# Patient Record
Sex: Male | Born: 1995 | Race: White | Hispanic: No
Health system: Southern US, Community
[De-identification: ages and names within clinical notes are randomized; demographics above are authoritative.]

## PROBLEM LIST (undated history)

## (undated) DIAGNOSIS — N4400 Torsion of testis, unspecified: Secondary | ICD-10-CM

## (undated) HISTORY — PX: HERNIA REPAIR: SHX51

## (undated) HISTORY — DX: Torsion of testis, unspecified: N44.00

---

## 2019-07-23 DIAGNOSIS — F32A Depression, unspecified: Secondary | ICD-10-CM

## 2019-07-23 DIAGNOSIS — F419 Anxiety disorder, unspecified: Secondary | ICD-10-CM

## 2019-07-23 DIAGNOSIS — I498 Other specified cardiac arrhythmias: Secondary | ICD-10-CM

## 2019-07-23 HISTORY — DX: Depression, unspecified: F32.A

## 2019-07-23 HISTORY — DX: Other specified cardiac arrhythmias: I49.8

## 2019-07-23 HISTORY — DX: Anxiety disorder, unspecified: F41.9

## 2020-07-07 ENCOUNTER — Emergency Department (HOSPITAL_COMMUNITY): Payer: No Typology Code available for payment source

## 2020-07-07 ENCOUNTER — Emergency Department (HOSPITAL_COMMUNITY)
Admission: EM | Admit: 2020-07-07 | Discharge: 2020-07-07 | Disposition: A | Discharge status: Home / Self Care | Payer: No Typology Code available for payment source | Attending: Emergency Medicine | Admitting: Emergency Medicine

## 2020-07-07 ENCOUNTER — Encounter (HOSPITAL_COMMUNITY): Payer: Self-pay

## 2020-07-07 ENCOUNTER — Other Ambulatory Visit: Payer: Self-pay

## 2020-07-07 DIAGNOSIS — X501XXA Overexertion from prolonged static or awkward postures, initial encounter: Secondary | ICD-10-CM | POA: Insufficient documentation

## 2020-07-07 DIAGNOSIS — M25551 Pain in right hip: Secondary | ICD-10-CM

## 2020-07-07 DIAGNOSIS — S7001XA Contusion of right hip, initial encounter: Secondary | ICD-10-CM | POA: Diagnosis not present

## 2020-07-07 DIAGNOSIS — S79911A Unspecified injury of right hip, initial encounter: Secondary | ICD-10-CM | POA: Diagnosis present

## 2020-07-07 DIAGNOSIS — Y990 Civilian activity done for income or pay: Secondary | ICD-10-CM | POA: Insufficient documentation

## 2020-07-07 IMAGING — CR DG HIP (WITH OR WITHOUT PELVIS) 2-3V*R*
3 series · 3 of 3 positions shown · non-contrast
Comparison: None.

CLINICAL DATA: 24-year-old male status post twisting injury with
anterior and lateral hip pain.

EXAM:
DG HIP (WITH OR WITHOUT PELVIS) 2-3V RIGHT

[x pelvis]
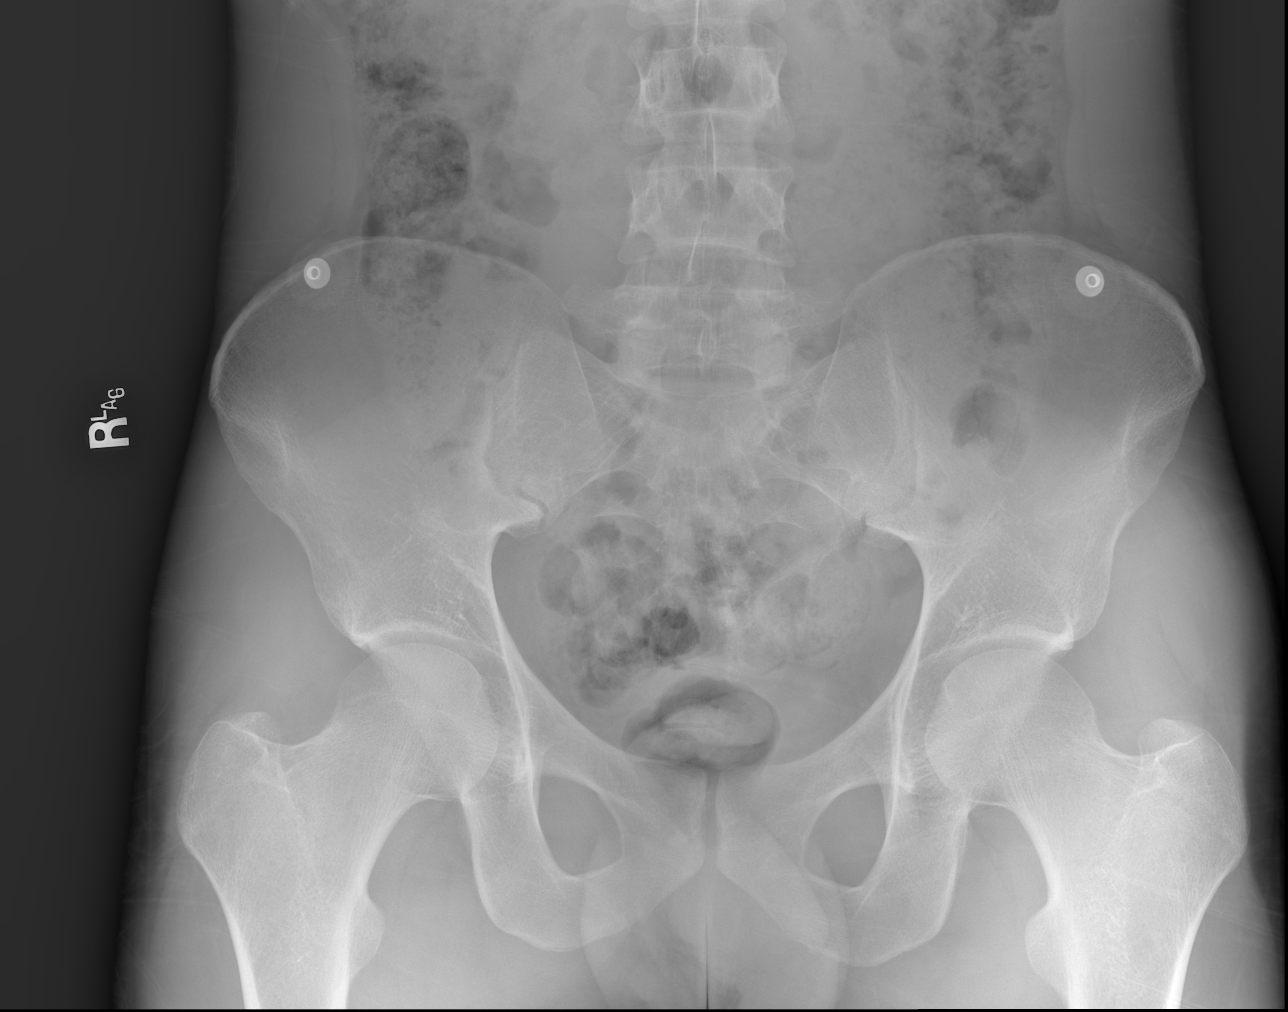

[x hip ap right (1 of 2)]
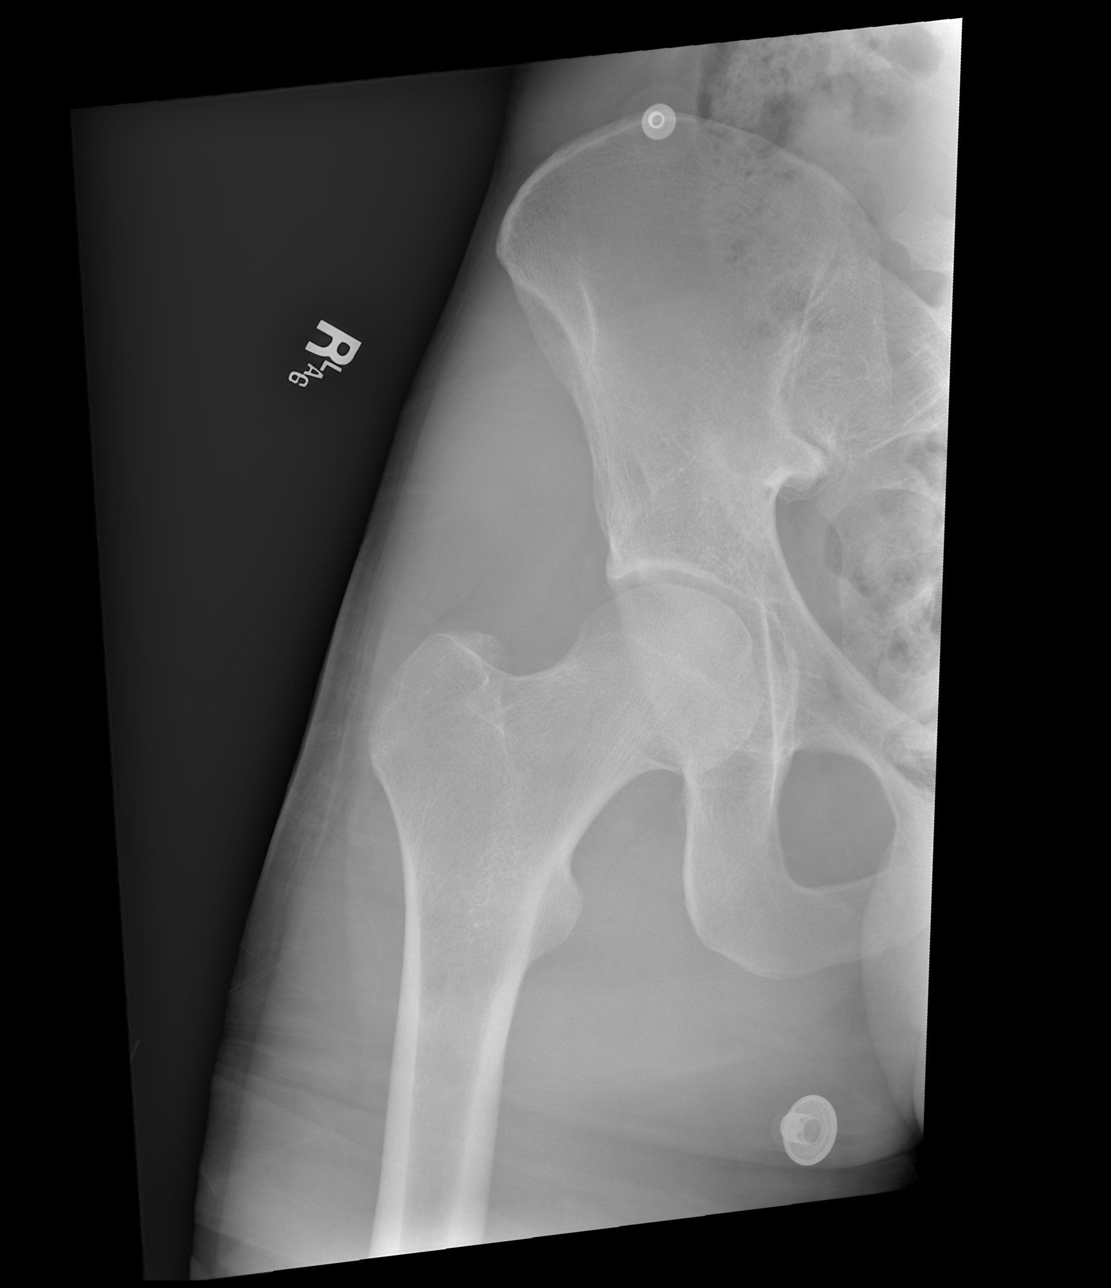

[x hip ap right (2 of 2)]
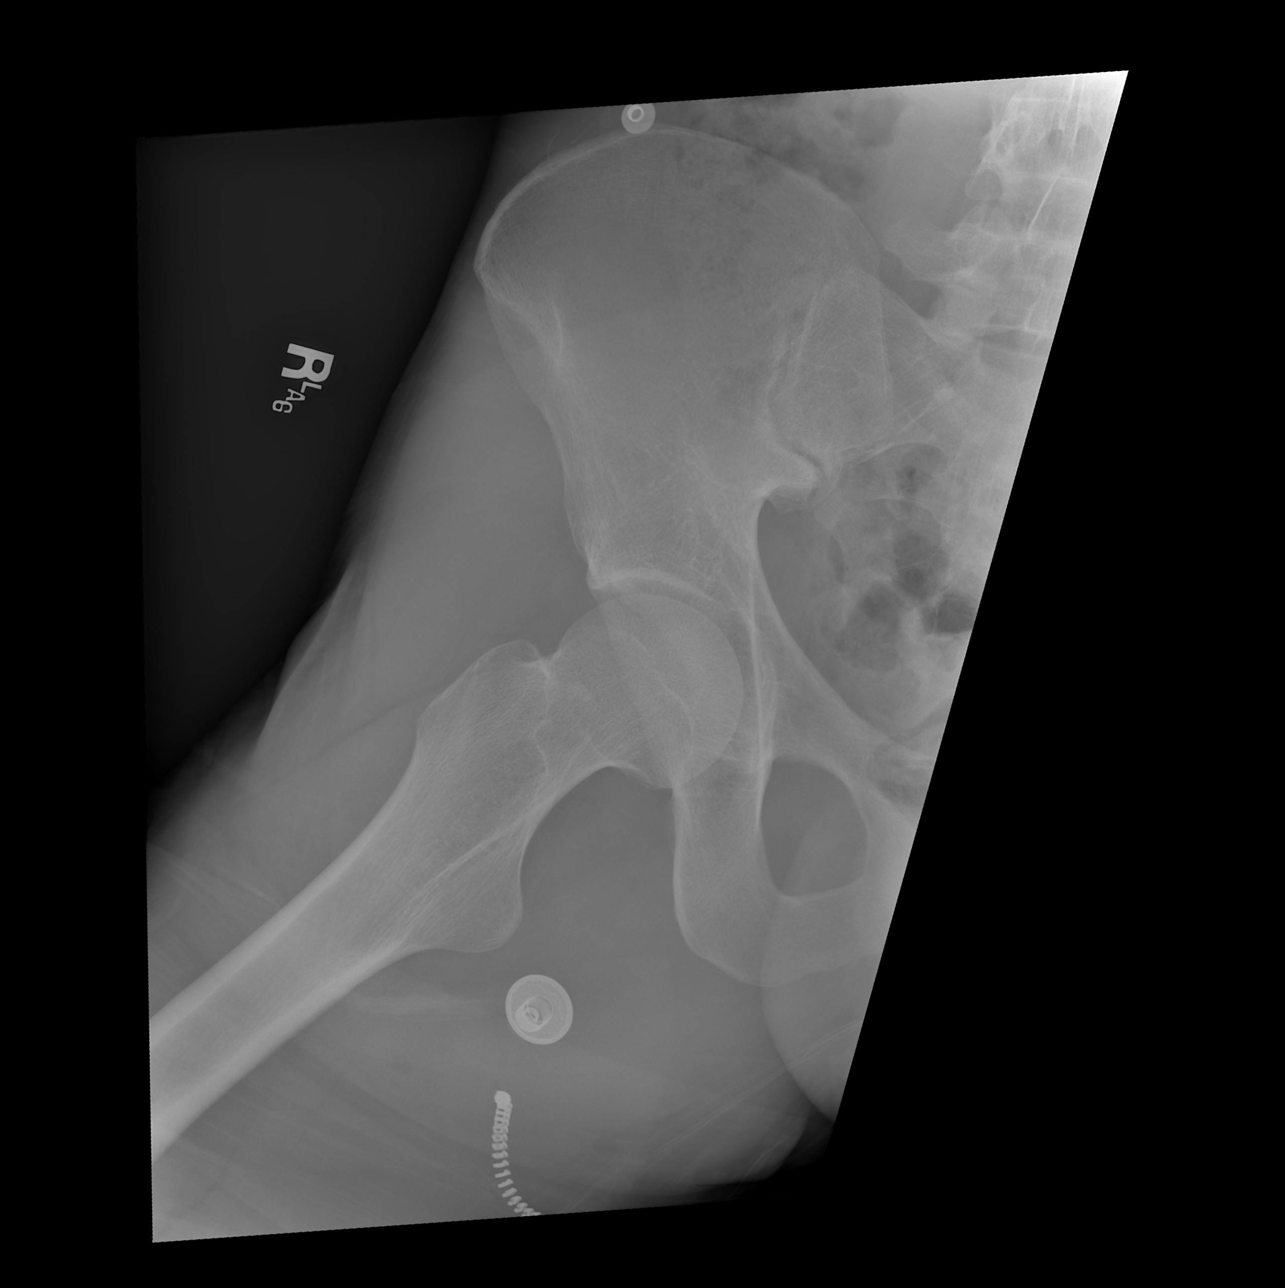

[3 of 3 positions shown; findings below may reference images not displayed]

FINDINGS: Bone mineralization is within normal limits. Femoral heads normally
located. There is no evidence of hip fracture or dislocation. Hip
joint spaces appear symmetric and normal. SI joints appear normal.
Negative lower abdominal and pelvic visceral contours.
IMPRESSION: Negative.

## 2020-07-07 MED ORDER — IBUPROFEN 800 MG PO TABS
800.0000 mg | ORAL_TABLET | Freq: Once | ORAL | Status: AC
  Administered 2020-07-07: 07:00:00 800 mg via ORAL
  Filled 2020-07-07: qty 1

## 2020-07-07 MED ORDER — HYDROCODONE-ACETAMINOPHEN 5-325 MG PO TABS
2.0000 | ORAL_TABLET | Freq: Once | ORAL | Status: AC
Start: 2020-07-07 — End: 2020-07-07
  Administered 2020-07-07: 07:00:00 2 via ORAL
  Filled 2020-07-07: qty 2

## 2020-07-07 NOTE — ED Provider Notes (Addendum)
[facility]-EMERGENCY DEPT Provider Note   CSN: [id number] Arrival date & time: 07/07/20  0500     History Chief Complaint  Patient presents with  . Hip Pain    Tyler Owens is a 24 y.o. male.  The history is provided by the patient and medical records.    24 y.o. M here with right hip pain.  States he has had trouble with his right hip for a few years, has been trying to get into the [place] to get this evaluated.  States he works for [employer] and tonight while climbing into the back of his truck, right leg gave out and felt a pop in back of his hip.  No head injury or LOC.  States he has continued pain in right hip and back, mildly into back of right leg.  Denies numbness/weakness of the legs.  No bowel or bladder incontinence.  States usually he doesn't take anything for pain.  Was given 2 doses of fentanyl en route to ED by EMS.  No past medical history on file.  There are no problems to display for this patient.    No family history on file.     Home Medications Prior to Admission medications   Not on File    Allergies    Patient has no allergy information on record.  Review of Systems   Review of Systems  Musculoskeletal: Positive for arthralgias and back pain.  All other systems reviewed and are negative.   Physical Exam Updated Vital Signs BP (!) 114/101 (BP Location: Right Arm)   Pulse 61   Temp 97.9 F (36.6 C) (Oral)   Resp 18   Ht 6' (1.829 m)   Wt 77.1 kg   SpO2 100%   BMI 23.06 kg/m   Physical Exam Vitals and nursing note reviewed.  Constitutional:      Appearance: He is well-developed and well-nourished.  HENT:     Head: Normocephalic and atraumatic.     Mouth/Throat:     Mouth: Oropharynx is clear and moist.  Eyes:     Extraocular Movements: EOM normal.     Conjunctiva/sclera: Conjunctivae normal.     Pupils: Pupils are equal, round, and reactive to light.  Cardiovascular:     Rate and Rhythm: Normal rate and regular  rhythm.     Heart sounds: Normal heart sounds.  Pulmonary:     Effort: Pulmonary effort is normal.     Breath sounds: Normal breath sounds.  Abdominal:     General: Bowel sounds are normal.     Palpations: Abdomen is soft.  Musculoskeletal:        General: Normal range of motion.     Cervical back: Normal range of motion.     Comments: Small bruise to right lateral hip (old per patient) Tenderness along right posterior hip and SI joint, no gross deformity, able to flex/extend hip independently without issue, some crepitus noted with ROM, no leg shortening No midline lumbar spinal tenderness, pain elicited with SLR on right  Skin:    General: Skin is warm and dry.  Neurological:     Mental Status: He is alert and oriented to person, place, and time.  Psychiatric:        Mood and Affect: Mood and affect normal.     ED Results / Procedures / Treatments   Labs (all labs ordered are listed, but only abnormal results are displayed) Labs Reviewed - No data to display  EKG None  Radiology DG Hip Unilat W or Wo Pelvis 2-3 Views Right  Result Date: 07/07/2020 CLINICAL DATA:  24 year old male status post twisting injury with anterior and lateral hip pain. EXAM: DG HIP (WITH OR WITHOUT PELVIS) 2-3V RIGHT COMPARISON:  None. FINDINGS: Bone mineralization is within normal limits. Femoral heads normally located. There is no evidence of hip fracture or dislocation. Hip joint spaces appear symmetric and normal. SI joints appear normal. Negative lower abdominal and pelvic visceral contours. IMPRESSION: Negative. Electronically Signed   By: Odessa Fleming M.D.   On: 07/07/2020 06:03    Procedures Procedures (including critical care time)  Medications Ordered in ED Medications - No data to display  ED Course  I have reviewed the triage vital signs and the nursing notes.  Pertinent labs & imaging results that were available during my care of the patient were reviewed by me and considered in my  medical decision making (see chart for details).    MDM Rules/Calculators/A&P  24 year old male presenting to the ED with right hip pain after leg gave out while trying to jump into back of [employer] struck.  No acute deformities noted on exam.  Does have some tenderness along posterior aspect and into SI joint but no acute deformities, no leg shortening.  Able to range hip, some crepitus noted.  Leg is NVI.  No midline lumbar tenderness or deformity.  No bowel/bladder incontinence, no focal numbness/weakness.  X-ray obtained, no acute findings, SI joints appear normal.  Does report ongoing problems with right hip over the past few years, seen by [facility] and plans to go there for further follow-up.  Continue symptomatic care.  Return here for any new/acute changes.  Final Clinical Impression(s) / ED Diagnoses Final diagnoses:  Right hip pain    Rx / DC Orders ED Discharge Orders    None       Garlon Hatchet, PA-C 07/07/20 0616    Garlon Hatchet, PA-C 07/07/20 0631    Ward, Layla Maw, DO 07/07/20 [id number]

## 2020-07-07 NOTE — ED Notes (Signed)
Pt medicated as ordered, ortho tech at bedside to assist with crutches. Pt requesting MRI of hip to be done, MD Ward made aware, advised pt to follow up with PCP for outpt MRI. Imaging called for xray disc to be made for pt to take to [facility].

## 2020-07-07 NOTE — Discharge Instructions (Signed)
Hip films today were normal. I would follow-up closely with your primary care doctor. Tylenol or motrin for pain when needed. Return here for any new/acute changes.

## 2020-07-07 NOTE — ED Notes (Signed)
Pt discharged from this ED in stable condition at this time. All discharge instructions and follow up care reviewed with pt with no further questions at this time. Pt ambulatory with steady gait on crutches, clear speech.

## 2020-07-07 NOTE — ED Notes (Signed)
Patient says he is in too much pain to leave, though XRAY was negative. Provider made aware

## 2020-07-07 NOTE — ED Notes (Signed)
Pt has requested a disc of his XRAY to take to the [facility] hospital prior to discharge. I have called radiology main to obtain this disc

## 2020-07-07 NOTE — ED Triage Notes (Signed)
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 HX of hip injury (right). Cannot move leg but can move toes. No obvious deformity but bruise on right side. Pain is centered in right hip.

## 2020-07-13 ENCOUNTER — Other Ambulatory Visit (HOSPITAL_COMMUNITY): Payer: Self-pay | Admitting: Family

## 2020-07-13 DIAGNOSIS — M25551 Pain in right hip: Secondary | ICD-10-CM

## 2020-07-25 ENCOUNTER — Other Ambulatory Visit: Payer: Self-pay

## 2020-07-25 ENCOUNTER — Ambulatory Visit (HOSPITAL_COMMUNITY)
Admission: RE | Admit: 2020-07-25 | Discharge: 2020-07-25 | Disposition: A | Discharge status: Home / Self Care | Payer: No Typology Code available for payment source | Source: Ambulatory Visit | Attending: Family | Admitting: Family

## 2020-07-25 DIAGNOSIS — M25551 Pain in right hip: Secondary | ICD-10-CM | POA: Diagnosis present

## 2020-07-25 IMAGING — MR MR HIP*R* W/O CM
4 of 7 series · 19 of 40 positions shown · non-contrast
Comparison: X-ray [DATE]

CLINICAL DATA: Posterior right hip pain

EXAM:
MR OF THE RIGHT HIP WITHOUT CONTRAST
TECHNIQUE: Multiplanar, multisequence MR imaging was performed. No intravenous
contrast was administered.

[Series 3: T2 fat-sat · coronal · 4.0mm · 0.70mm/px · 4 of 38 slices shown (1 of 2)]
[im 1/38]
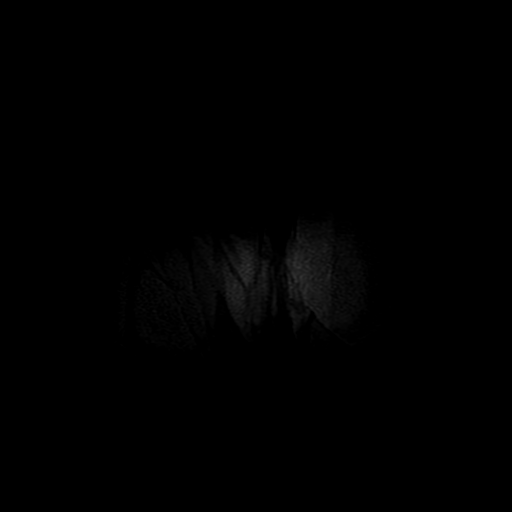
[im 13/38]
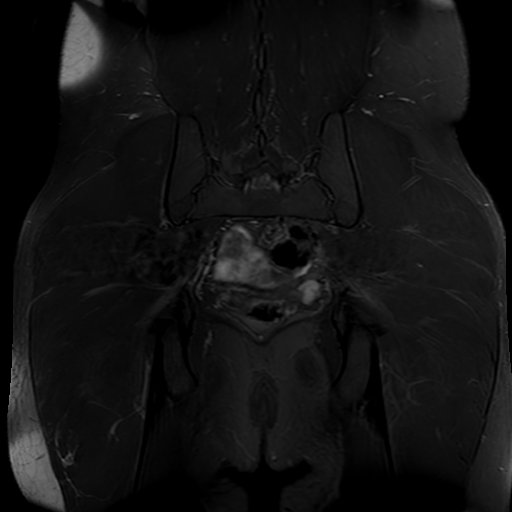
[im 25/38]
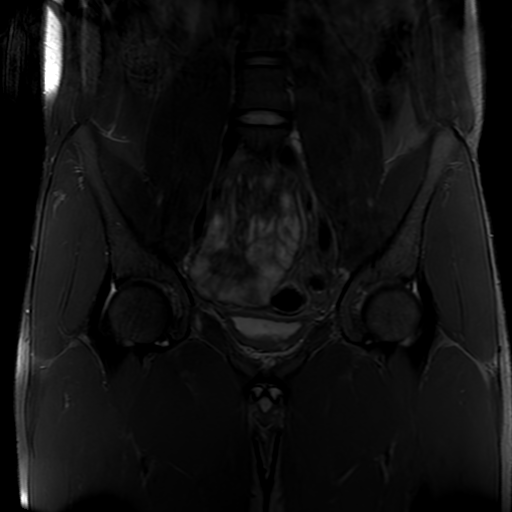
[im 38/38]
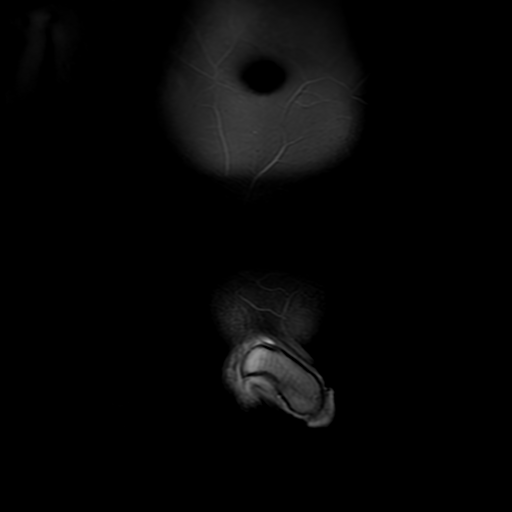

[Series 5: T2 fat-sat · axial · 4.0mm · 0.70mm/px · z∈[-187,+33]mm · 5 of 54 slices shown (2 of 2)]
[im 1/54]
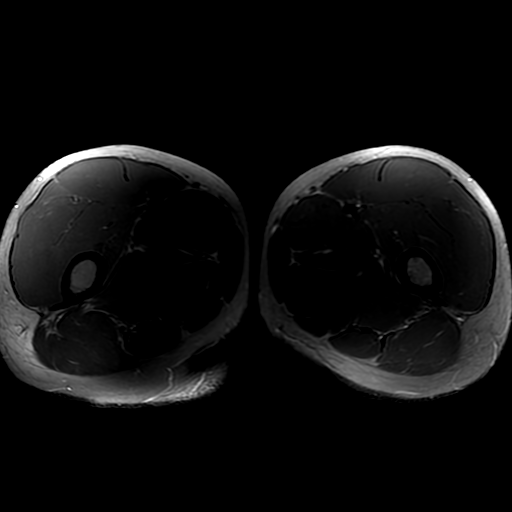
[im 9/54]
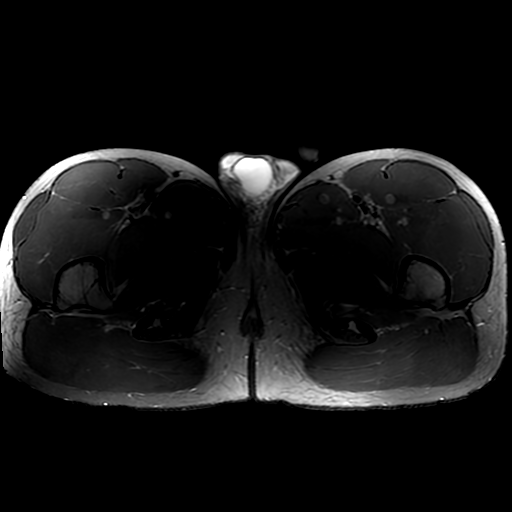
[im 18/54]
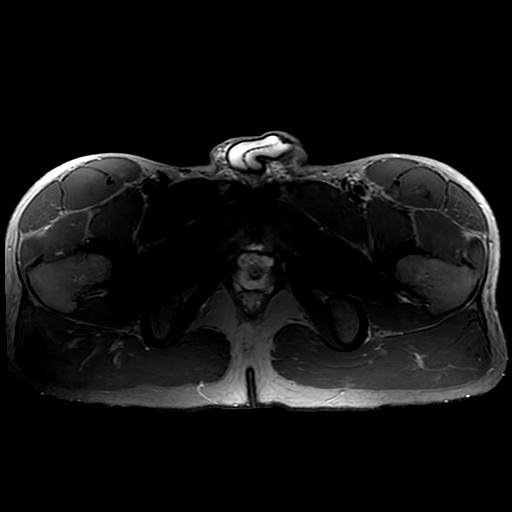
[im 27/54]
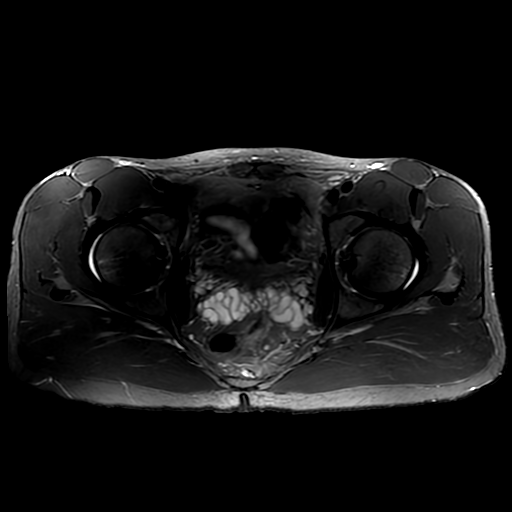
[im 45/54]
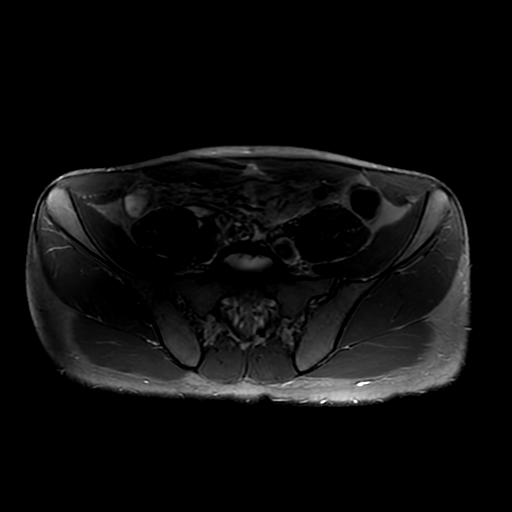

[Series 7: PD fat-sat · sagittal · 4.0mm · 0.35mm/px · 6 of 44 slices shown (1 of 2)]
[im 1/44]
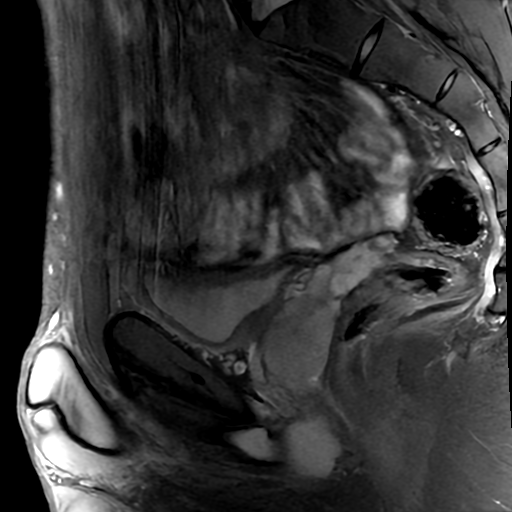
[im 9/44]
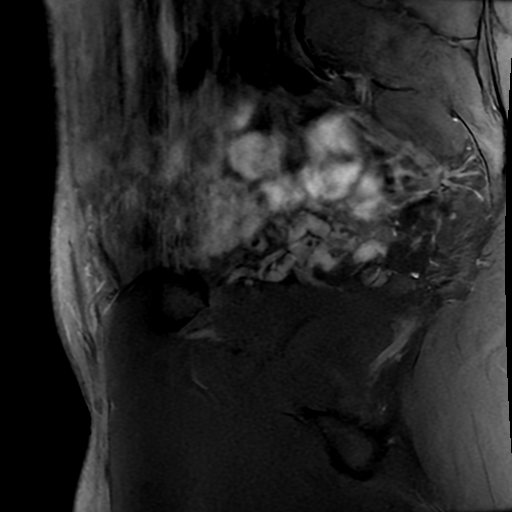
[im 18/44]
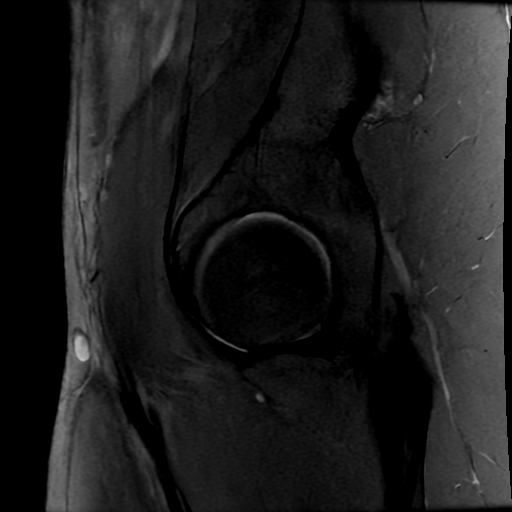
[im 26/44]
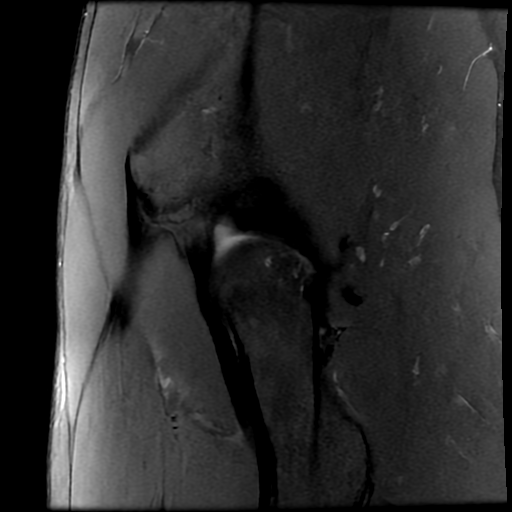
[im 35/44]
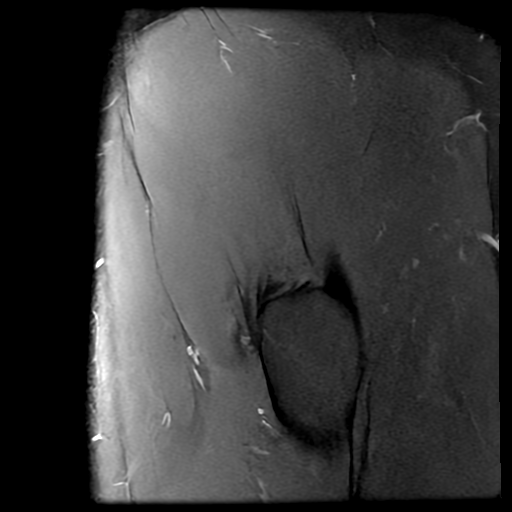
[im 44/44]
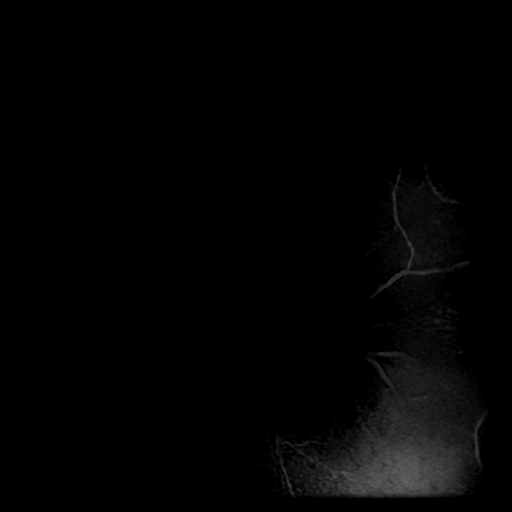

[Series 8: PD fat-sat · coronal · 4.0mm · 0.35mm/px · 4 of 33 slices shown (2 of 2)]
[im 1/33]
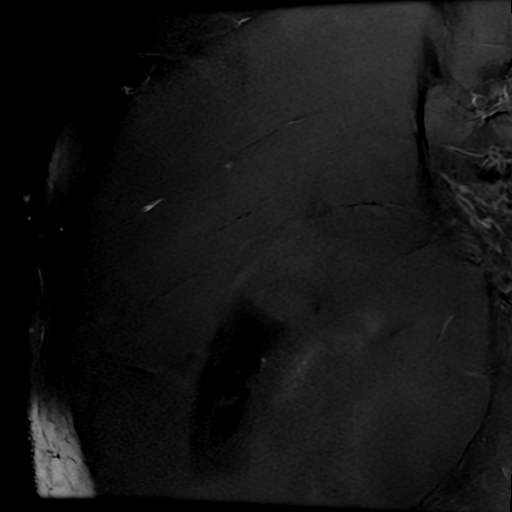
[im 11/33]
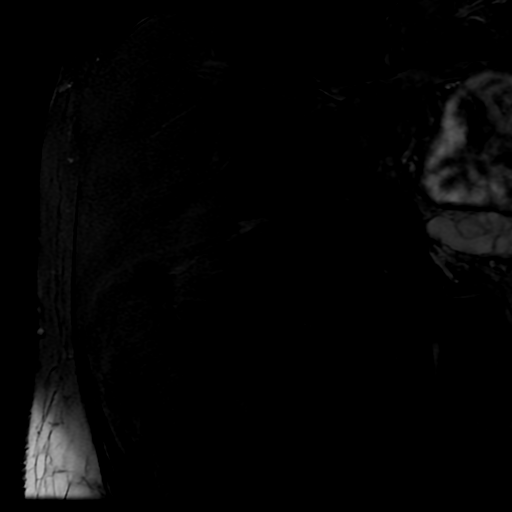
[im 22/33]
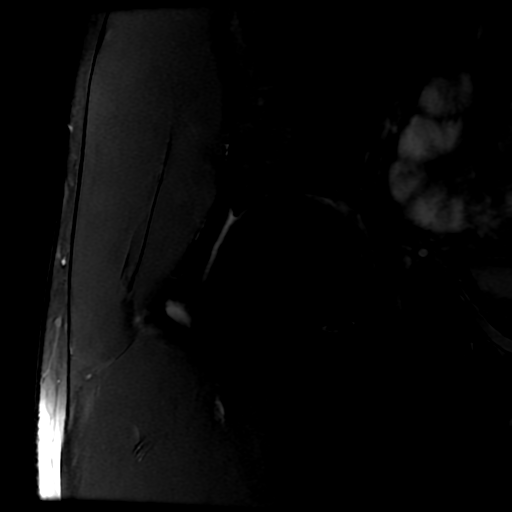
[im 33/33]
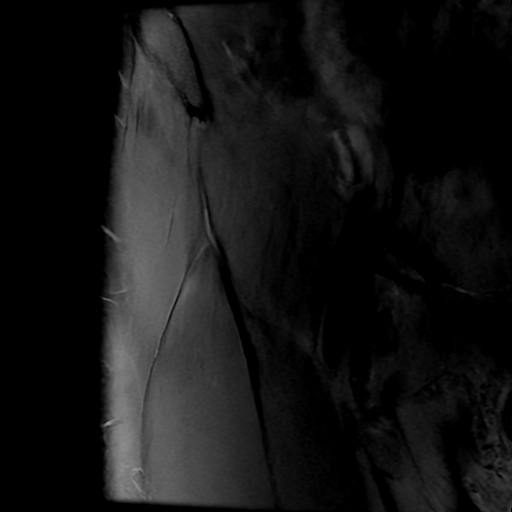

[19 of 40 positions shown; findings below may reference images not displayed]

FINDINGS: Bones: No acute fracture. No dislocation. No femoral head avascular
necrosis. Bony pelvis intact. Sacroiliac joints are normal in
appearance without arthropathy or evidence of sacroiliitis.
Unremarkable pubic symphysis. Visualized lower lumbar spine is
grossly unremarkable with well-hydrated discs. No bone marrow edema.
No marrow replacing bone lesion.

Articular cartilage and labrum

Articular cartilage: Intact without focal cartilage defect. No
subchondral marrow signal changes.

Labrum: No evidence of labral tear on non-arthrographic imaging. No
paralabral cyst.

Joint or bursal effusion

Joint effusion:  None.

Bursae: No abnormal bursal fluid collection.

Muscles and tendons

Muscles and tendons: The gluteal, hamstring, iliopsoas, rectus
femoris, and adductor tendons appear intact without tear or
significant tendinosis. Normal muscle bulk and signal intensity
without edema, atrophy, or fatty infiltration.

Other findings

Miscellaneous: No soft tissue edema or fluid collection. No inguinal
adenopathy. No acute findings within the visualized pelvis.
IMPRESSION: Normal MRI of the right hip.

## 2020-07-28 ENCOUNTER — Other Ambulatory Visit (HOSPITAL_COMMUNITY): Payer: Self-pay | Admitting: Family

## 2020-07-28 ENCOUNTER — Other Ambulatory Visit: Payer: Self-pay | Admitting: Family

## 2020-07-28 ENCOUNTER — Other Ambulatory Visit: Payer: Self-pay | Admitting: Physician Assistant

## 2020-07-28 DIAGNOSIS — M5416 Radiculopathy, lumbar region: Secondary | ICD-10-CM

## 2020-07-28 DIAGNOSIS — R200 Anesthesia of skin: Secondary | ICD-10-CM

## 2020-08-04 ENCOUNTER — Ambulatory Visit (HOSPITAL_COMMUNITY): Admission: RE | Admit: 2020-08-04 | Payer: No Typology Code available for payment source | Source: Ambulatory Visit

## 2020-08-04 ENCOUNTER — Encounter (HOSPITAL_COMMUNITY): Payer: Self-pay

## 2020-08-13 ENCOUNTER — Other Ambulatory Visit: Payer: Self-pay

## 2020-08-13 ENCOUNTER — Ambulatory Visit (HOSPITAL_COMMUNITY)
Admission: RE | Admit: 2020-08-13 | Discharge: 2020-08-13 | Disposition: A | Discharge status: Home / Self Care | Payer: No Typology Code available for payment source | Source: Ambulatory Visit | Attending: Physician Assistant | Admitting: Physician Assistant

## 2020-08-13 DIAGNOSIS — R200 Anesthesia of skin: Secondary | ICD-10-CM | POA: Insufficient documentation

## 2020-08-13 DIAGNOSIS — M5416 Radiculopathy, lumbar region: Secondary | ICD-10-CM | POA: Diagnosis present

## 2020-08-13 IMAGING — MR MR LUMBAR SPINE W/O CM
4 of 5 series · 19 of 48 positions shown · non-contrast
Comparison: None.

CLINICAL DATA: Low back pain

EXAM:
MRI LUMBAR SPINE WITHOUT CONTRAST
TECHNIQUE: Multiplanar, multisequence MR imaging of the lumbar spine was
performed. No intravenous contrast was administered.

[Series 2: T2 · sagittal · 4.0mm · 0.55mm/px · 6 of 15 slices shown (1 of 2)]
[im 1/15]
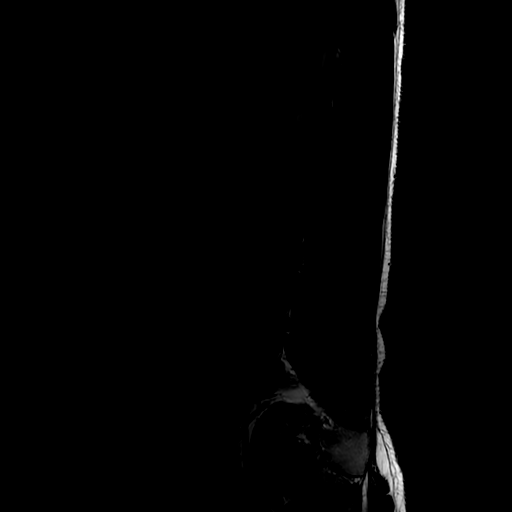
[im 3/15]
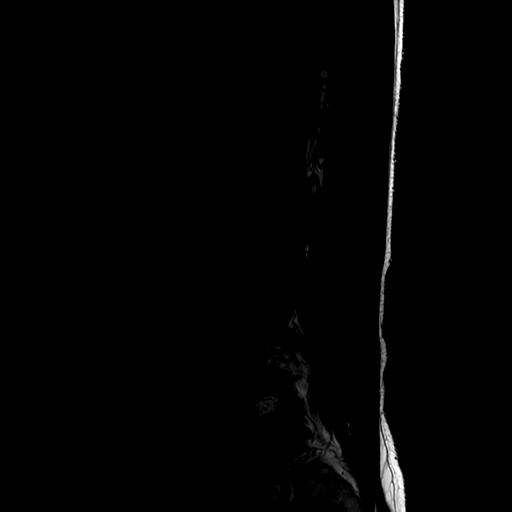
[im 6/15]
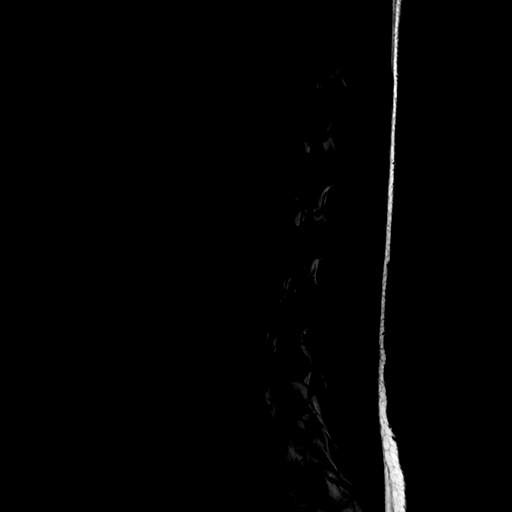
[im 9/15]
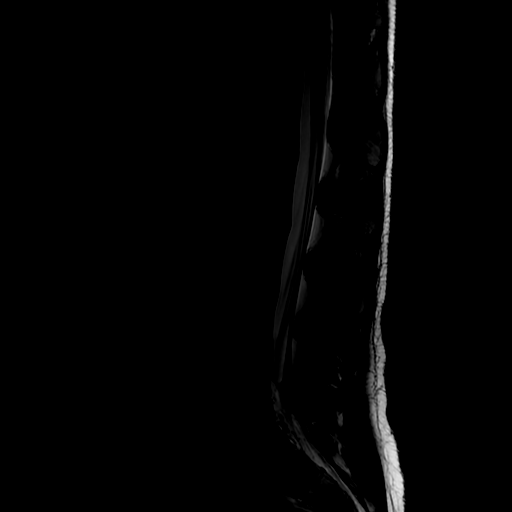
[im 12/15]
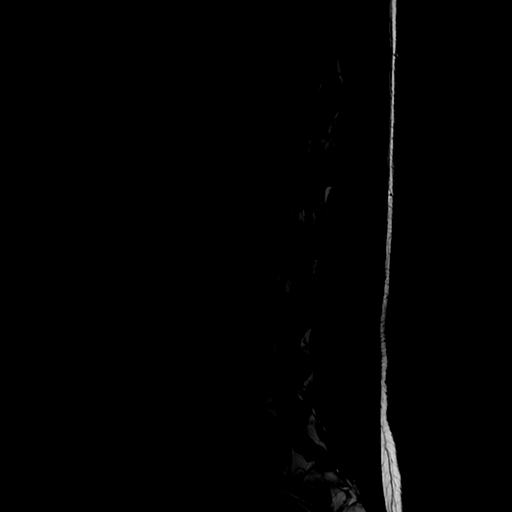
[im 15/15]
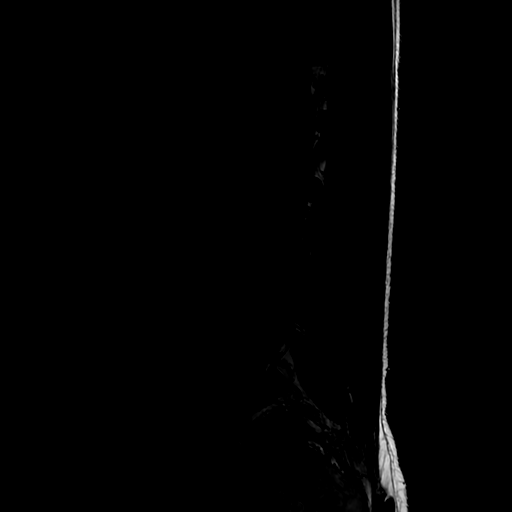

[Series 4: T1 · sagittal · 4.0mm · 0.55mm/px · 3 of 15 slices shown (1 of 2)]
[im 3/15]
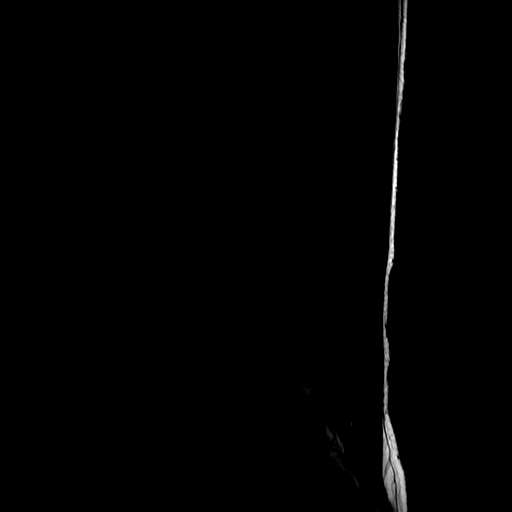
[im 9/15]
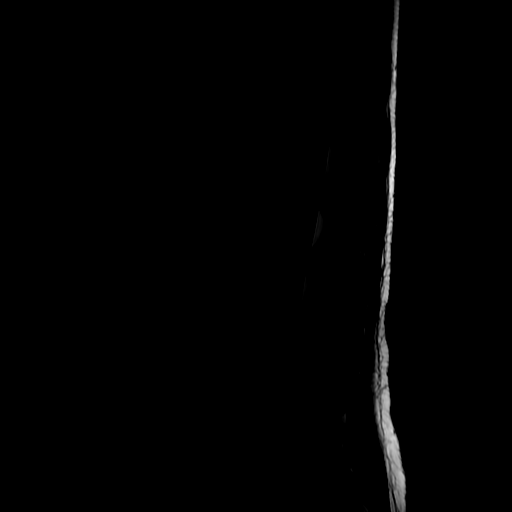
[im 15/15]
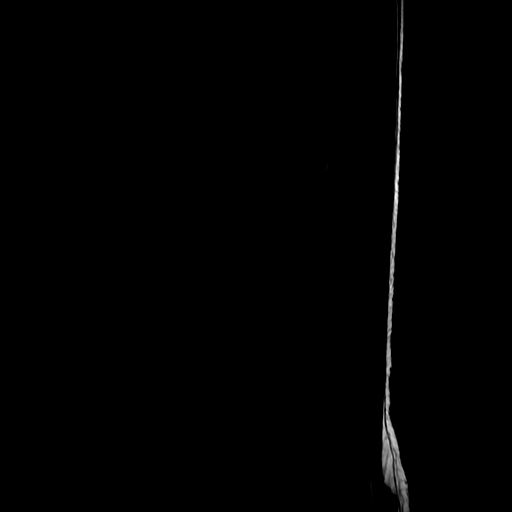

[Series 5: T2 · axial · 4.0mm · 0.39mm/px · z∈[-136,+32]mm · 7 of 37 slices shown (2 of 2)]
[im 1/37]
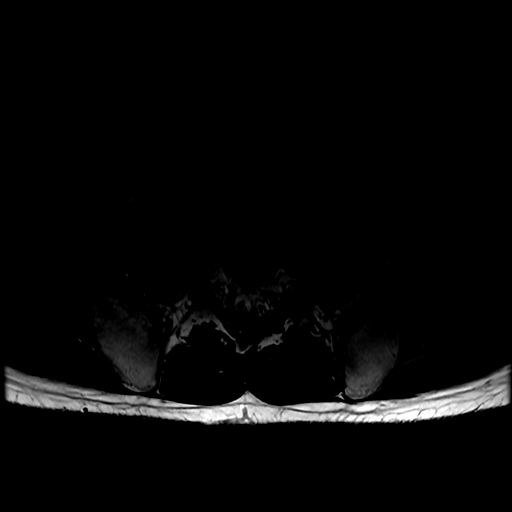
[im 6/37]
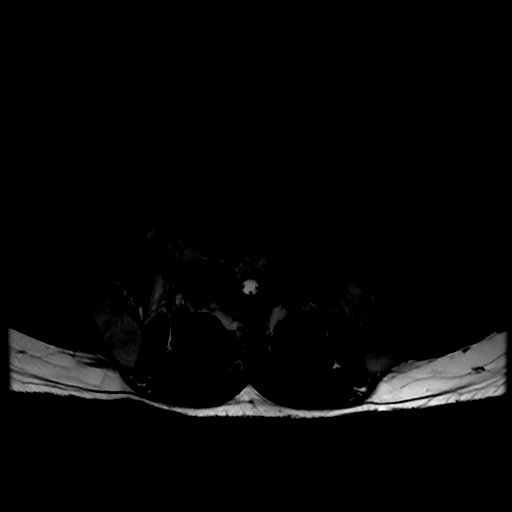
[im 11/37]
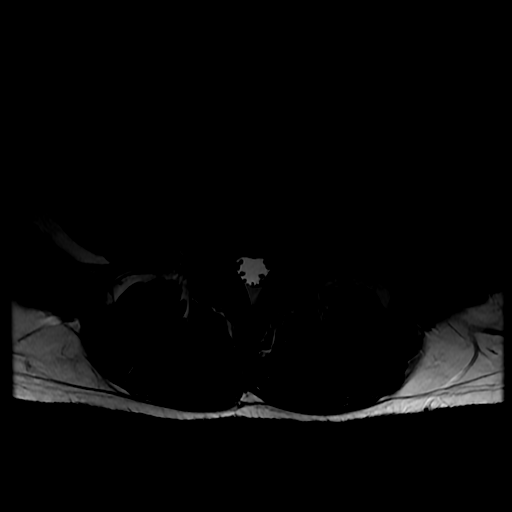
[im 16/37]
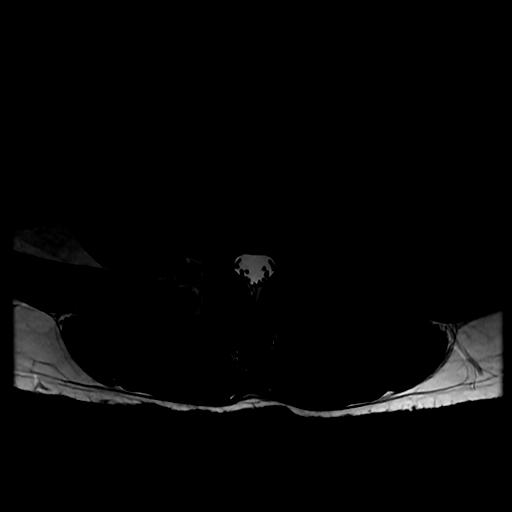
[im 19/37]
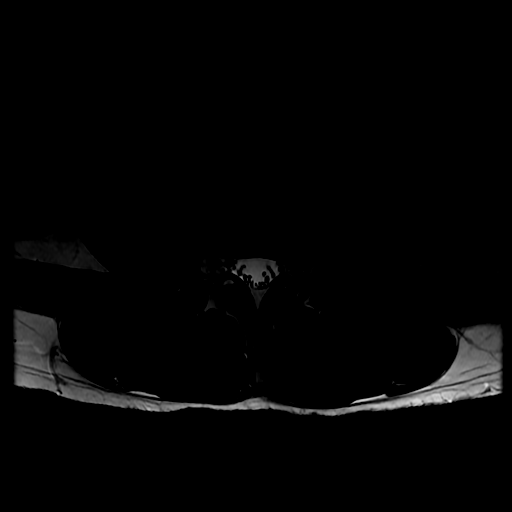
[im 21/37]
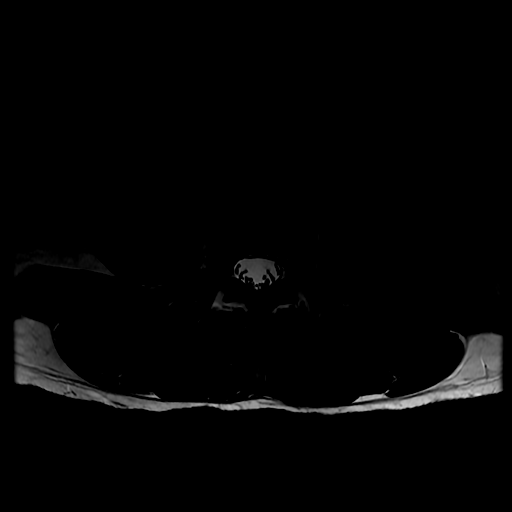
[im 31/37]
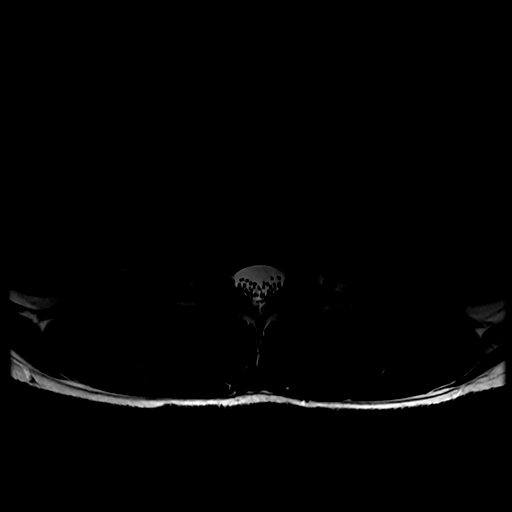

[Series 6: T1 · axial · 4.0mm · 0.39mm/px · z∈[-111,+32]mm · 3 of 37 slices shown (2 of 2)]
[im 6/37]
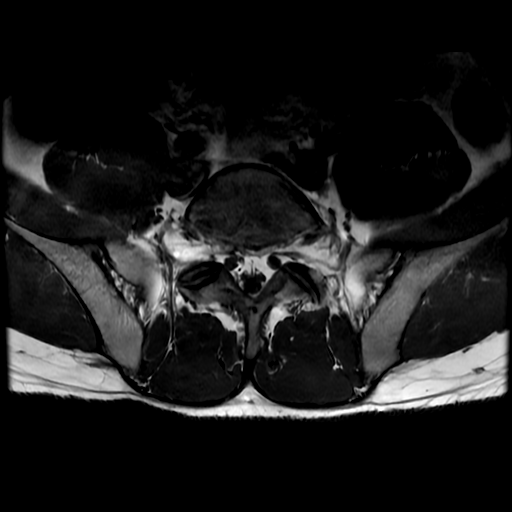
[im 19/37]
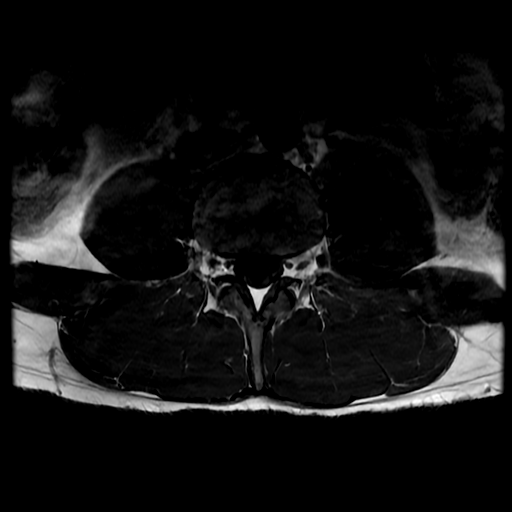
[im 31/37]
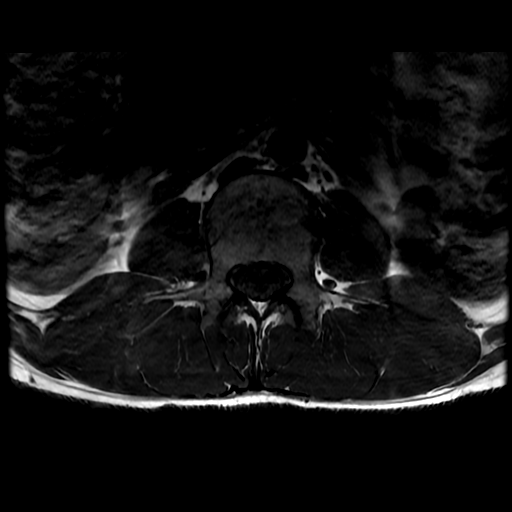

[19 of 48 positions shown; findings below may reference images not displayed]

FINDINGS: Segmentation: Presumed standard anatomy with the inferior-most well
developed disc space designated as L5-S1.

Alignment:  Physiologic.

Vertebrae:  No fracture, evidence of discitis, or bone lesion.

Conus medullaris and cauda equina: Conus extends to the L1 level.
Conus and cauda equina appear normal.

Paraspinal and other soft tissues: Negative.

Disc levels:

T12-L1: No significant disc protrusion, foraminal stenosis, or canal
stenosis.

L1-L2: No significant disc protrusion, foraminal stenosis, or canal
stenosis.

L2-L3: No significant disc protrusion, foraminal stenosis, or canal
stenosis.

L3-L4: No significant disc protrusion, foraminal stenosis, or canal
stenosis.

L4-L5: Minimal broad-based disc bulge with small right
extraforaminal protrusion. Disc material laterally displaces the
exiting right L4 nerve root (series 5, image 27). No foraminal or
canal stenosis.

L5-S1: No significant disc protrusion, foraminal stenosis, or canal
stenosis.
IMPRESSION: 1. Small right extraforaminal disc protrusion at L4-L5 laterally
displaces the exiting right L4 nerve root. Correlate for radicular
symptoms in this nerve root distribution.
2. Otherwise unremarkable MRI of the lumbar spine. No significant
foraminal or canal stenosis at any level.
